# Patient Record
Sex: Female | Born: 1980 | Race: White | Hispanic: No | Marital: Single
Health system: Southern US, Community
[De-identification: ages and names within clinical notes are randomized; demographics above are authoritative.]

---

## 2003-04-15 ENCOUNTER — Emergency Department (HOSPITAL_COMMUNITY): Admission: EM | Admit: 2003-04-15 | Discharge: 2003-04-15 | Payer: Self-pay | Admitting: Emergency Medicine

## 2003-04-15 ENCOUNTER — Encounter: Payer: Self-pay | Admitting: Emergency Medicine

## 2014-01-22 ENCOUNTER — Ambulatory Visit: Payer: Self-pay | Admitting: Surgery

## 2014-01-22 LAB — URINALYSIS, COMPLETE
Bilirubin,UR: NEGATIVE
Glucose,UR: NEGATIVE mg/dL (ref 0–75)
Ketone: NEGATIVE
Leukocyte Esterase: NEGATIVE
Nitrite: NEGATIVE
Ph: 6 (ref 4.5–8.0)
Protein: NEGATIVE
RBC,UR: 3 /HPF (ref 0–5)
Specific Gravity: 1.008 (ref 1.003–1.030)
Squamous Epithelial: 1
WBC UR: 2 /HPF (ref 0–5)

## 2014-01-22 LAB — CBC
HCT: 42.3 % (ref 35.0–47.0)
HGB: 14.2 g/dL (ref 12.0–16.0)
MCH: 33 pg (ref 26.0–34.0)
MCHC: 33.6 g/dL (ref 32.0–36.0)
MCV: 98 fL (ref 80–100)
Platelet: 240 10*3/uL (ref 150–440)
RBC: 4.3 10*6/uL (ref 3.80–5.20)
RDW: 12.7 % (ref 11.5–14.5)
WBC: 6.3 10*3/uL (ref 3.6–11.0)

## 2014-01-22 LAB — TROPONIN I: Troponin-I: <0.02 ng/mL

## 2014-01-22 LAB — COMPREHENSIVE METABOLIC PANEL
Albumin: 4.3 g/dL (ref 3.4–5.0)
Alkaline Phosphatase: 36 U/L — ABNORMAL LOW
Anion Gap: 7 (ref 7–16)
BUN: 9 mg/dL (ref 7–18)
Bilirubin,Total: 0.4 mg/dL (ref 0.2–1.0)
Calcium, Total: 9.5 mg/dL (ref 8.5–10.1)
Chloride: 108 mmol/L — ABNORMAL HIGH (ref 98–107)
Co2: 25 mmol/L (ref 21–32)
Creatinine: 0.84 mg/dL (ref 0.60–1.30)
EGFR (African American): >60
EGFR (Non-African Amer.): >60
Glucose: 124 mg/dL — ABNORMAL HIGH (ref 65–99)
Osmolality: 280 (ref 275–301)
Potassium: 3.9 mmol/L (ref 3.5–5.1)
SGOT(AST): 11 U/L — ABNORMAL LOW (ref 15–37)
SGPT (ALT): 21 U/L (ref 12–78)
Sodium: 140 mmol/L (ref 136–145)
Total Protein: 7.4 g/dL (ref 6.4–8.2)

## 2014-01-22 LAB — LIPASE, BLOOD: Lipase: 258 U/L (ref 73–393)

## 2014-01-22 LAB — HCG, QUANTITATIVE, PREGNANCY: Beta Hcg, Quant.: <1 m[IU]/mL — ABNORMAL LOW

## 2014-01-24 LAB — PATHOLOGY REPORT

## 2014-03-03 ENCOUNTER — Ambulatory Visit: Payer: Self-pay | Admitting: Surgery

## 2014-03-03 LAB — HEPATIC FUNCTION PANEL A (ARMC)
Albumin: 4 g/dL (ref 3.4–5.0)
Alkaline Phosphatase: 39 U/L — ABNORMAL LOW
Bilirubin, Direct: 0.1 mg/dL (ref 0.00–0.20)
Bilirubin,Total: 0.5 mg/dL (ref 0.2–1.0)
SGOT(AST): 13 U/L — ABNORMAL LOW (ref 15–37)
SGPT (ALT): 23 U/L
Total Protein: 7 g/dL (ref 6.4–8.2)

## 2014-03-05 ENCOUNTER — Emergency Department: Payer: Self-pay | Admitting: Emergency Medicine

## 2014-03-05 LAB — URINALYSIS, COMPLETE
Bilirubin,UR: NEGATIVE
Blood: NEGATIVE
Glucose,UR: NEGATIVE mg/dL (ref 0–75)
Ketone: NEGATIVE
LEUKOCYTE ESTERASE: NEGATIVE
NITRITE: NEGATIVE
PROTEIN: NEGATIVE
Ph: 6 (ref 4.5–8.0)
SPECIFIC GRAVITY: 1.009 (ref 1.003–1.030)
Squamous Epithelial: 1

## 2014-03-05 LAB — CBC WITH DIFFERENTIAL/PLATELET
Basophil #: 0 10*3/uL (ref 0.0–0.1)
Basophil %: 0.5 %
Eosinophil #: 0.2 10*3/uL (ref 0.0–0.7)
Eosinophil %: 3.7 %
HCT: 41.5 % (ref 35.0–47.0)
HGB: 13.7 g/dL (ref 12.0–16.0)
Lymphocyte #: 2.5 10*3/uL (ref 1.0–3.6)
Lymphocyte %: 45.1 %
MCH: 32.9 pg (ref 26.0–34.0)
MCHC: 33.1 g/dL (ref 32.0–36.0)
MCV: 99 fL (ref 80–100)
Monocyte #: 0.4 x10 3/mm (ref 0.2–0.9)
Monocyte %: 6.8 %
Neutrophil #: 2.5 10*3/uL (ref 1.4–6.5)
Neutrophil %: 43.9 %
Platelet: 246 10*3/uL (ref 150–440)
RBC: 4.18 10*6/uL (ref 3.80–5.20)
RDW: 12.3 % (ref 11.5–14.5)
WBC: 5.6 10*3/uL (ref 3.6–11.0)

## 2014-03-05 LAB — COMPREHENSIVE METABOLIC PANEL
ALBUMIN: 3.8 g/dL (ref 3.4–5.0)
ALK PHOS: 37 U/L — AB
ANION GAP: 6 — AB (ref 7–16)
BUN: 8 mg/dL (ref 7–18)
Bilirubin,Total: 0.3 mg/dL (ref 0.2–1.0)
Calcium, Total: 8.9 mg/dL (ref 8.5–10.1)
Chloride: 114 mmol/L — ABNORMAL HIGH (ref 98–107)
Co2: 24 mmol/L (ref 21–32)
Creatinine: 0.77 mg/dL (ref 0.60–1.30)
EGFR (African American): >60
GLUCOSE: 88 mg/dL (ref 65–99)
OSMOLALITY: 285 (ref 275–301)
Potassium: 4.2 mmol/L (ref 3.5–5.1)
SGOT(AST): 18 U/L (ref 15–37)
SGPT (ALT): 18 U/L
SODIUM: 144 mmol/L (ref 136–145)
Total Protein: 6.7 g/dL (ref 6.4–8.2)

## 2014-03-05 LAB — TROPONIN I: Troponin-I: <0.02 ng/mL

## 2014-03-05 LAB — LIPASE, BLOOD: Lipase: 223 U/L (ref 73–393)

## 2014-04-03 ENCOUNTER — Ambulatory Visit: Payer: Self-pay | Admitting: Gastroenterology

## 2014-11-04 NOTE — Op Note (Signed)
PATIENT NAME:  Lianne CureSHOEMAKER, Cora A MR#:  [id number] DATE OF BIRTH:  July 29, 1980  DATE OF PROCEDURE:  01/22/2014  PREOPERATIVE DIAGNOSIS: Hydrops of the gallbladder.   POSTOPERATIVE DIAGNOSIS: Symptomatic cholelithiasis with early hydrops.   PROCEDURE PERFORMED: Laparoscopic cholecystectomy.   ANESTHESIA: General endotracheal.   ESTIMATED BLOOD LOSS: 5 mL.   COMPLICATIONS: None.   SPECIMENS: Gallbladder.  INDICATION FOR SURGERY: This patient is a pleasant 34 year old female with a history of 1 day of right upper quadrant and epigastric pain. She was extremely tender on physical exam and was noted to have a nonmobile gallstone in her gallbladder. Thus brought into the operating room for presumed hydrops of gallbladder/cholecystitis.   DETAILS OF PROCEDURE:  Informed consent was obtained.  This patient was brought to the operating room suite. She was induced. Endotracheal tube was placed.  General anesthesia was administered. Her abdomen was prepped and draped in standard surgical fashion. A timeout was then performed correctly identifying the patient's name, operative site, and procedure to be performed.  A supraumbilical incision was made and this was deepened down to the infraumbilical fascia. The fascia was incised. The peritoneum was entered. Two stay sutures were placed through the fasciotomy.  A Hasson trocar was placed in the abdomen. The abdomen was insufflated.  An 11 mm epigastric and two 5 mm right subcostal trocars were placed. These were deepened down to the fascia. The gallbladder was then lifted over the dome of the liver. The cystic duct and cystic artery were dissected out, in addition, a large lymphatic which was dissected out again. All 3 structures were clipped and ligated. The gallbladder was then taken off the gallbladder fossa and brought out through the umbilical port site with an Endo Catch bag. The abdomen was irrigated. Hemostasis was obtained. All trocars were then removed  under direct visualization. The abdomen was desufflated. The infraumbilical fascia was closed with a figure-of-eight 0 Vicryl. The skin was then closed with 4-0 Monocryl deep dermal sutures. Steri-Strips, Telfa gauze, and Tegaderm were used to complete the dressing. The patient was then awoken, extubated, and brought to the postanesthesia care unit. There were no immediate complications. Needle, sponge, and instrument counts were correct at the end of the procedure.    ____________________________ Si Raiderhristopher A. Beauford Lando, MD cal:dd D: 01/22/2014 14:03:22 ET T: 01/22/2014 21:21:13 ET JOB#: [id number]  cc: Cristal Deerhristopher A. Kastiel Simonian, MD, <Dictator> Jarvis NewcomerHRISTOPHER A Leviticus Harton MD ELECTRONICALLY SIGNED 02/09/2014 14:50

## 2014-11-04 NOTE — H&P (Signed)
   Subjective/Chief Complaint RUQ pain x 1 day, nausea/vomiting   History of Present Illness Cynthia Sheppard is a pleasant 89 F who presents after waking up at 4 am with epigastric pain which radiates to RUQ.  Has never had before.  Colicky.  Nausea/vomiting.  No fevers chills.  No diarrhea/constipation.  Does take occasional NSAID for knee pain.   Past History Right knee pain   Past Med/Surgical Hx:  Cesarean Section x2:   ALLERGIES:  NKA: None  Family and Social History:  Family History Diabetes Mellitus  Diabetes in [place]   Social History positive  tobacco, negative ETOH, 1/2 ppd x 20 years   Place of Living Home  [place]   Review of Systems:  Subjective/Chief Complaint Epigastric/RUQ pain   Fever/Chills No   Cough No   Sputum No   Abdominal Pain Yes   Diarrhea No   Constipation No   Nausea/Vomiting Yes   SOB/DOE No   Chest Pain No   Dysuria No   Tolerating PT Yes   Tolerating Diet Nauseated  Vomiting   Physical Exam:  GEN well developed, well nourished, no acute distress   HEENT pink conjunctivae, PERRL, hearing intact to voice, moist oral mucosa   RESP normal resp effort  clear BS  no use of accessory muscles   CARD regular rate  no murmur  no thrills   ABD positive tenderness  soft  normal BS  RUQ tender to palp   EXTR negative cyanosis/clubbing, negative edema   SKIN normal to palpation, No rashes, No ulcers   NEURO cranial nerves intact, negative rigidity, negative tremor, follows commands   PSYCH A+O to time, place, person, good insight    Assessment/Admission Diagnosis Cynthia Sheppard is a pleasant 34 yo F with recent onset epigastric/RUQ pain.  U/S shows nonmobile gallstone.  suggestive of probable gallbladder hydrops but does take occasional NSAIDs.   Plan Give GI cocktail, if no response will plan on laparoscopic cholecystectomy.   Electronic Signatures: Jarvis NewcomerLundquist, Medrith Veillon A (MD)  (Signed 12-Jul-15 10:11)  Authored: CHIEF  COMPLAINT and HISTORY, PAST MEDICAL/SURGIAL HISTORY, ALLERGIES, FAMILY AND SOCIAL HISTORY, REVIEW OF SYSTEMS, PHYSICAL EXAM, ASSESSMENT AND PLAN   Last Updated: 12-Jul-15 10:11 by Jarvis NewcomerLundquist, Kassity Woodson A (MD)

## 2015-06-03 IMAGING — US ABDOMEN ULTRASOUND LIMITED
1 series · 14 of 25 positions shown · non-contrast
Comparison: Abdominal ultrasound - 01/22/2014

ADDENDUM:
Above report should state the gallbladder is surgically absent.
There are NO definitive fluid collections seen within the
gallbladder fossa.
CLINICAL DATA: Right upper quadrant abdominal pain. Post
cholecystectomy on 01/22/2014).

EXAM:
US ABDOMEN LIMITED - RIGHT UPPER QUADRANT

[Series 1: abdomen ultrasound limited · 0.25mm/px · 14 of 43 slices shown]
[im 1/43]
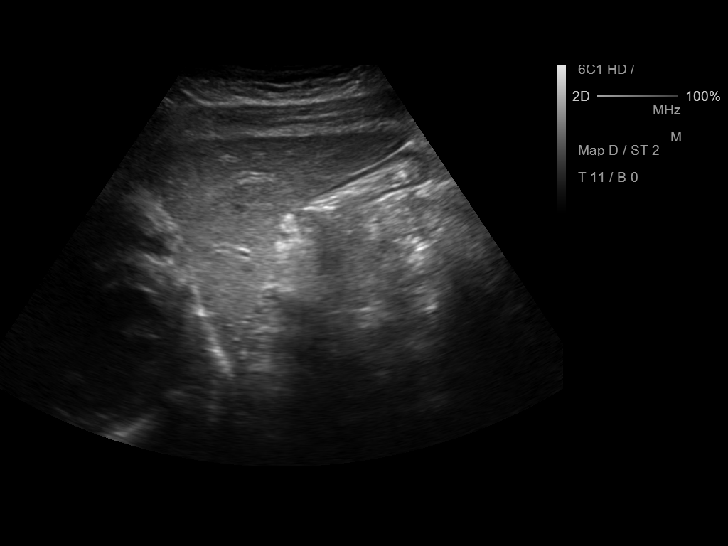
[im 4/43]
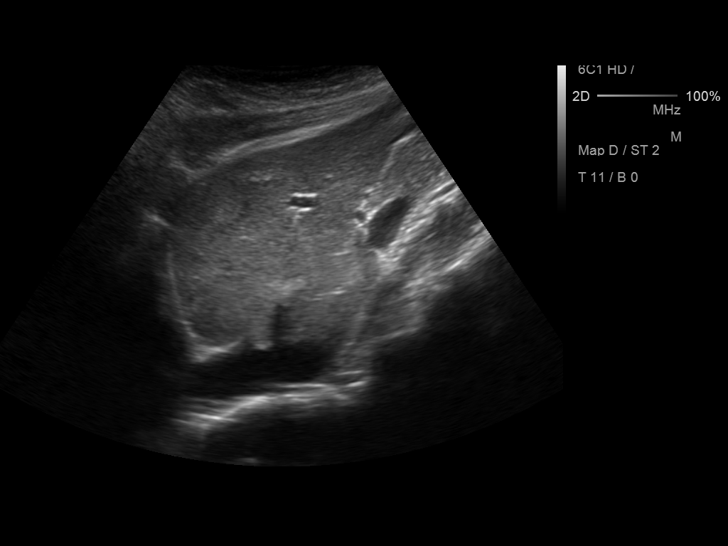
[im 8/43]
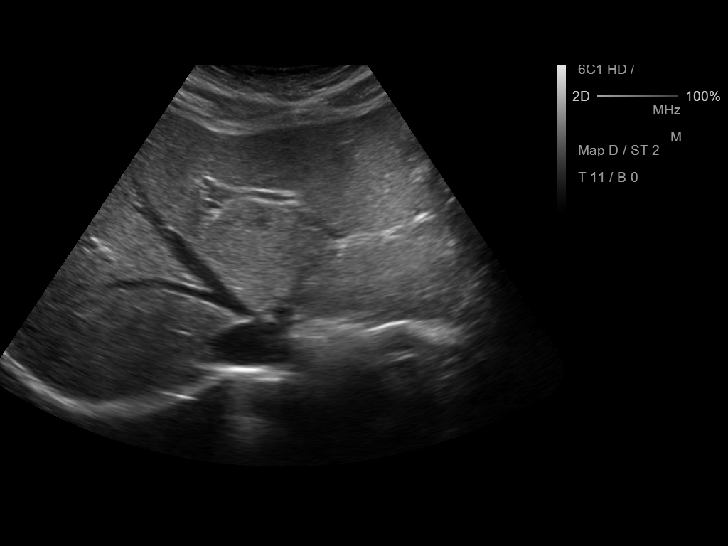
[im 11/43]
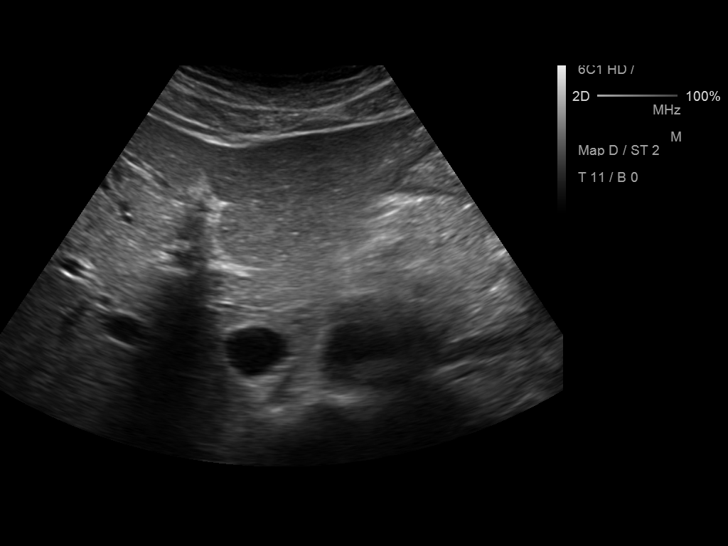
[im 15/43]
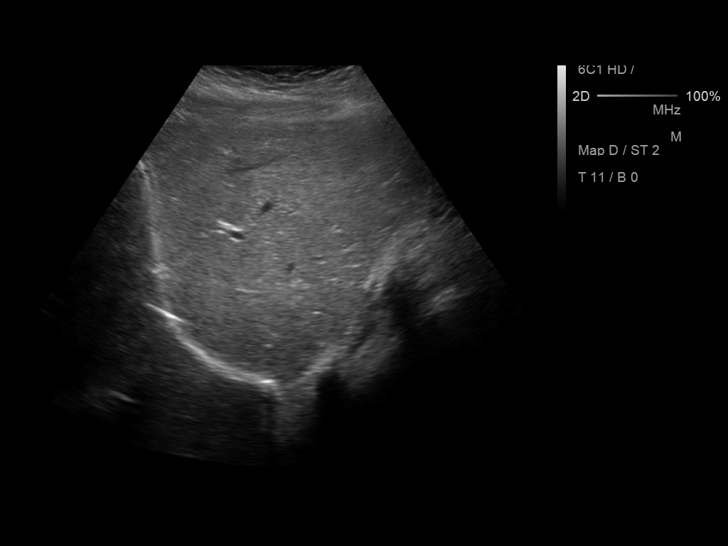
[im 16/43]
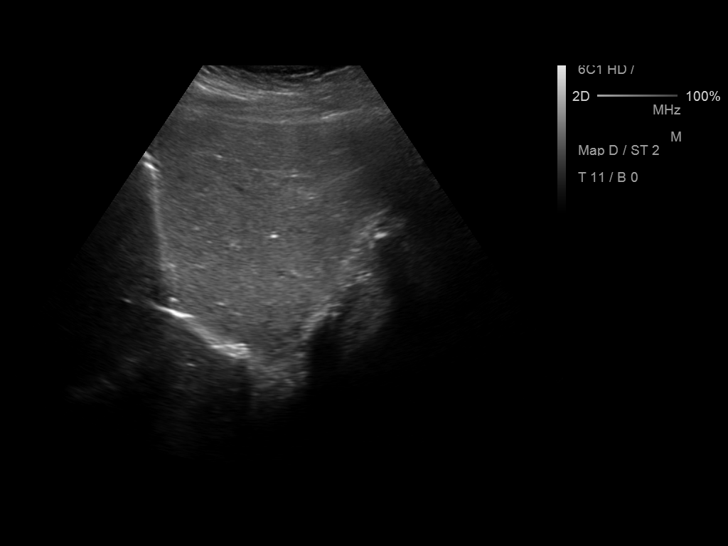
[im 20/43]
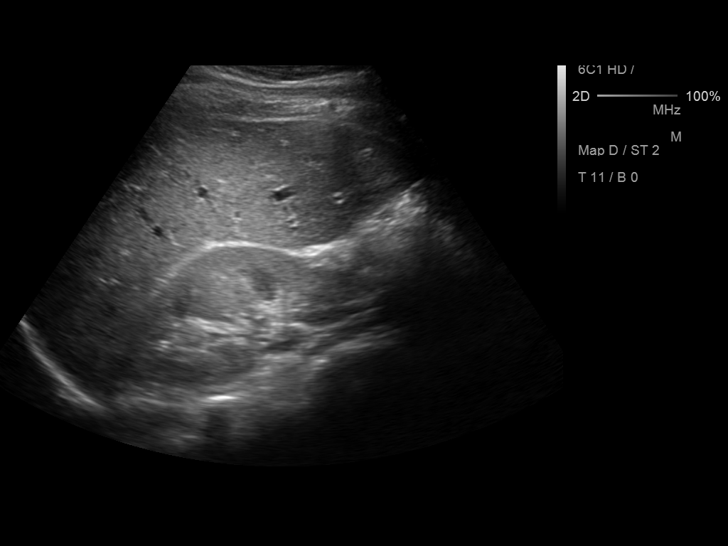
[im 23/43]
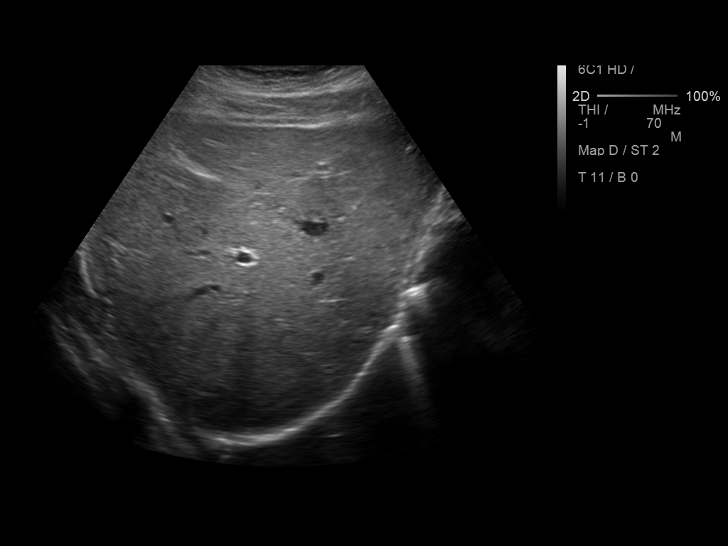
[im 27/43]
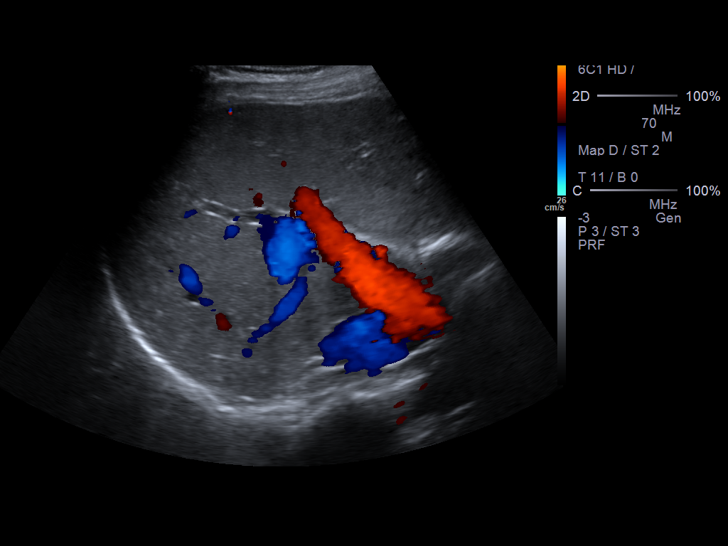
[im 29/43]
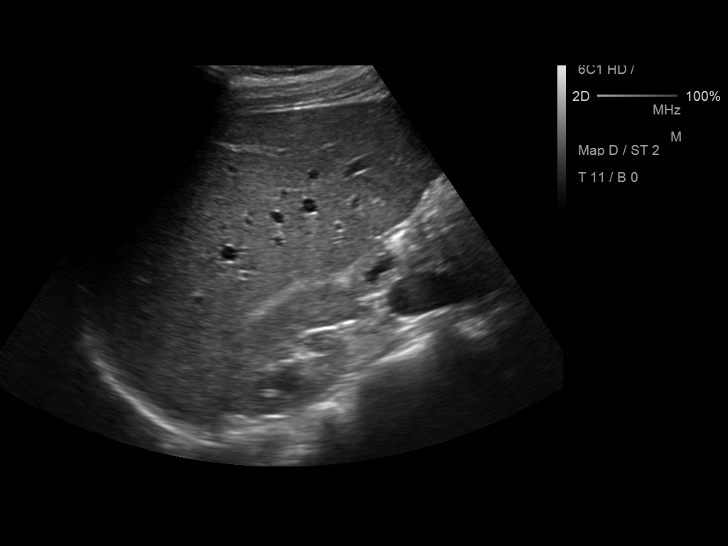
[im 32/43]
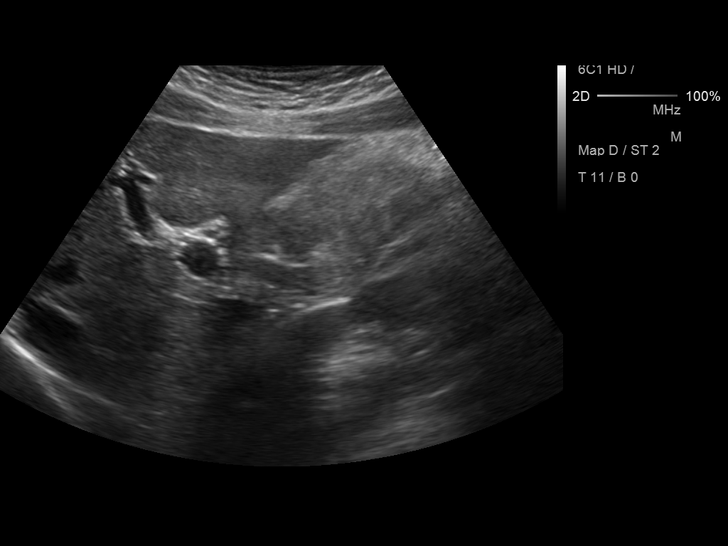
[im 36/43]
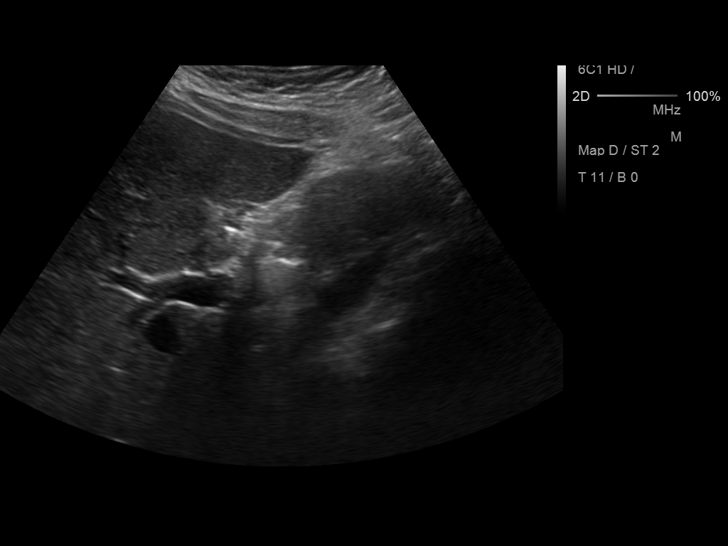
[im 39/43]
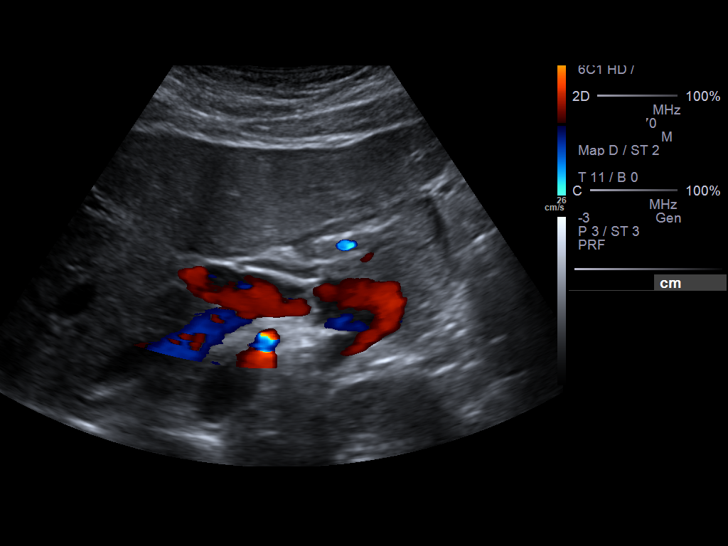
[im 43/43]
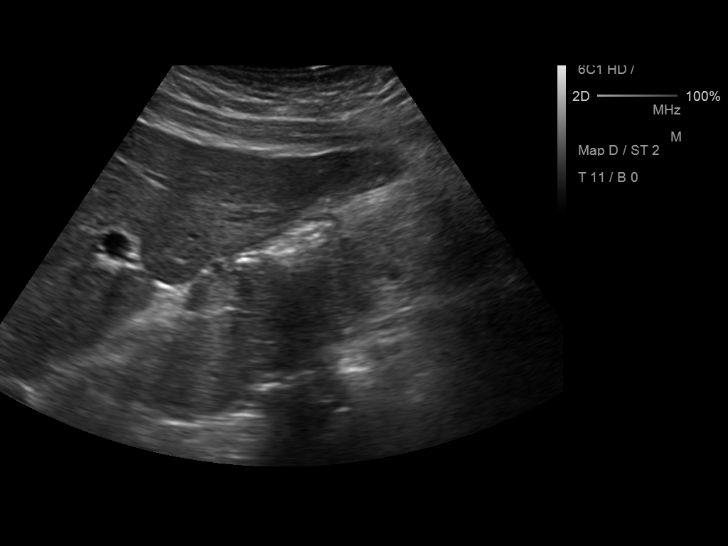

[14 of 25 positions shown; findings below may reference images not displayed]

FINDINGS: Gallbladder:

Surgically absent. There is noted definitive residual fluid seen
within the gallbladder fossa.

Common bile duct:

Diameter: Normal in size measuring 3.1 mm in diameter

Liver:

Homogeneous hepatic echotexture. No discrete hepatic lesions. No
definite evidence of intrahepatic biliary ductal dilatation. No
ascites.
IMPRESSION: No explanation for patient's right upper quadrant abdominal pain.
Specifically, post cholecystectomy without definite evidence of
complication.
